# Patient Record
Sex: Female | Born: 1940
Health system: Southern US, Community
[De-identification: ages and names within clinical notes are randomized; demographics above are authoritative.]

## PROBLEM LIST (undated history)

## (undated) DIAGNOSIS — Z950 Presence of cardiac pacemaker: Secondary | ICD-10-CM

## (undated) DIAGNOSIS — I4891 Unspecified atrial fibrillation: Secondary | ICD-10-CM

## (undated) HISTORY — PX: APPENDECTOMY: SHX54

## (undated) HISTORY — PX: ABDOMINAL HYSTERECTOMY: SHX81

## (undated) HISTORY — PX: HERNIA REPAIR: SHX51

## (undated) HISTORY — PX: TOTAL KNEE ARTHROPLASTY: SHX125

## (undated) HISTORY — PX: CHOLECYSTECTOMY: SHX55

---

## 2004-10-13 ENCOUNTER — Encounter: Admission: RE | Admit: 2004-10-13 | Discharge: 2004-10-13 | Payer: Self-pay | Admitting: Rheumatology

## 2004-10-13 IMAGING — CR DG CHEST 2V
2 series · 2 of 2 positions shown · non-contrast
Comparison: None.
 Heart size is normal.  A small hiatal hernia is seen.  Both lungs are clear.

CLINICAL DATA: Rheumatoid arthritis.  Previous smoker.  Screening prior to methotrexate therapy.
 CHEST ? TWO VIEW:

[view not recorded (1 of 2)]
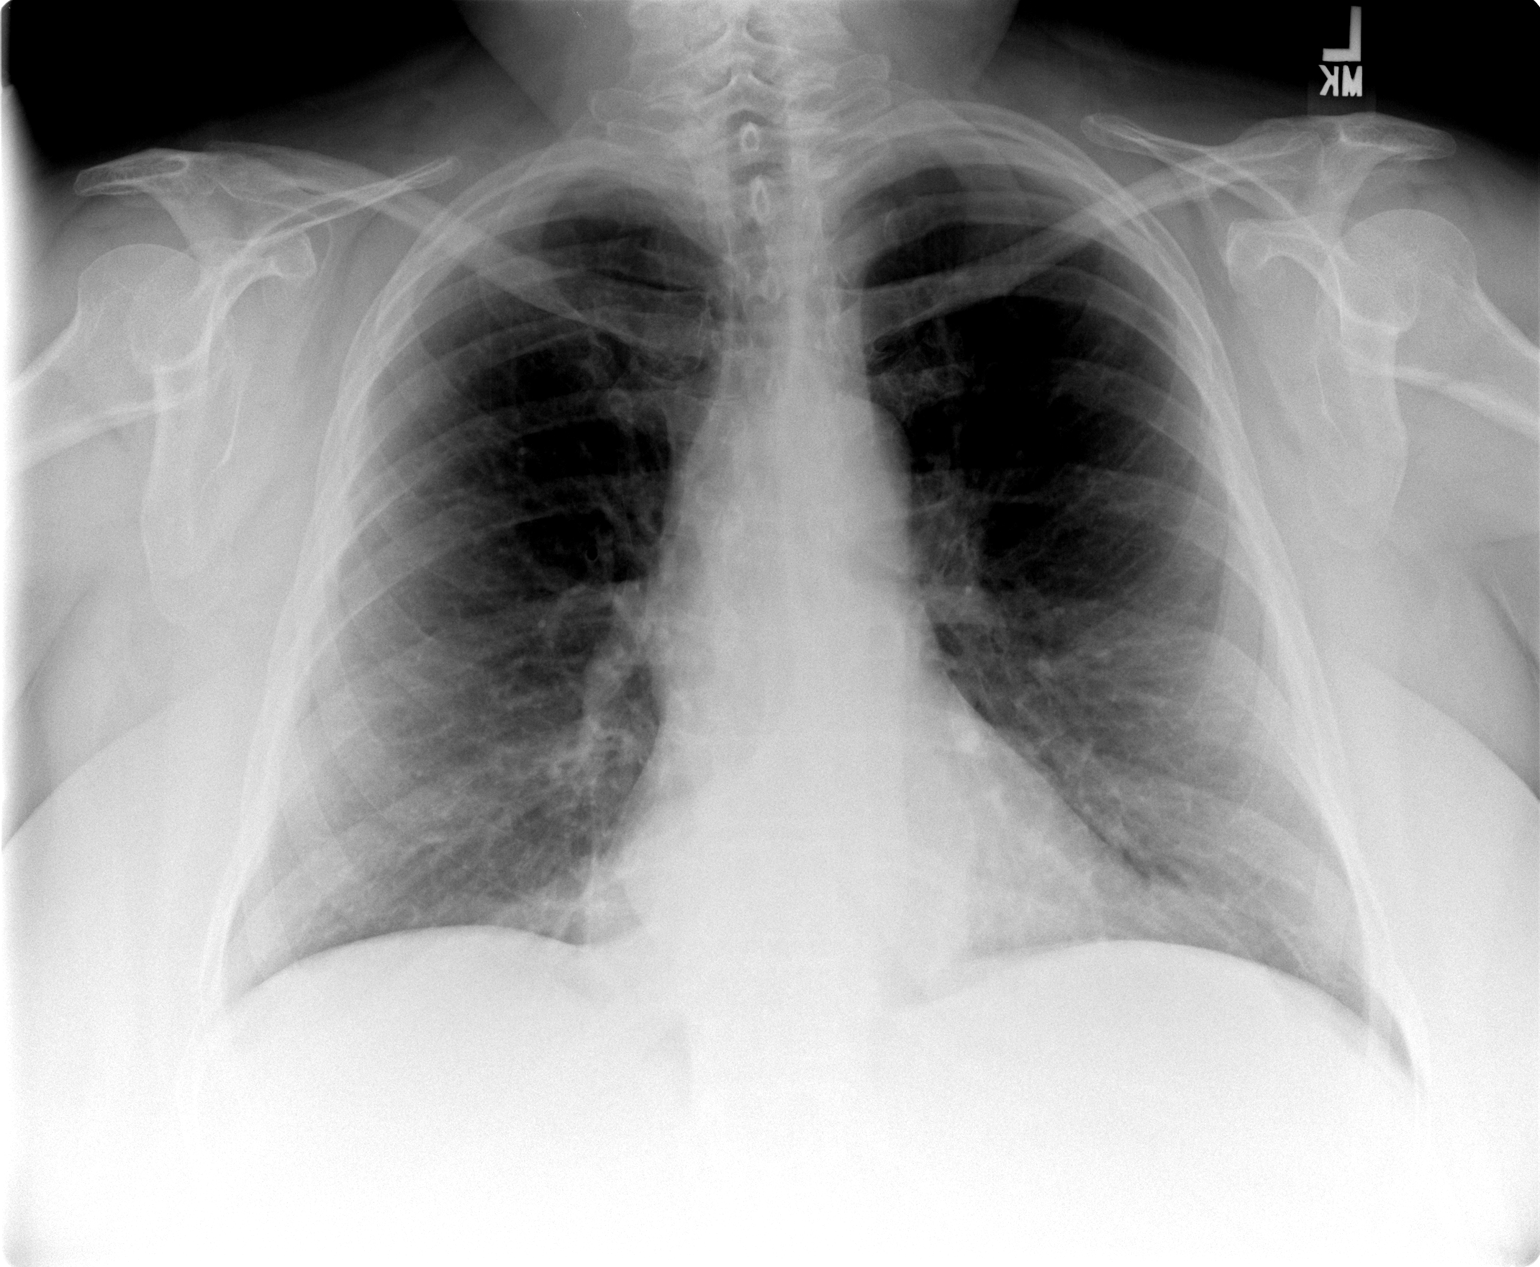

[view not recorded (2 of 2)]
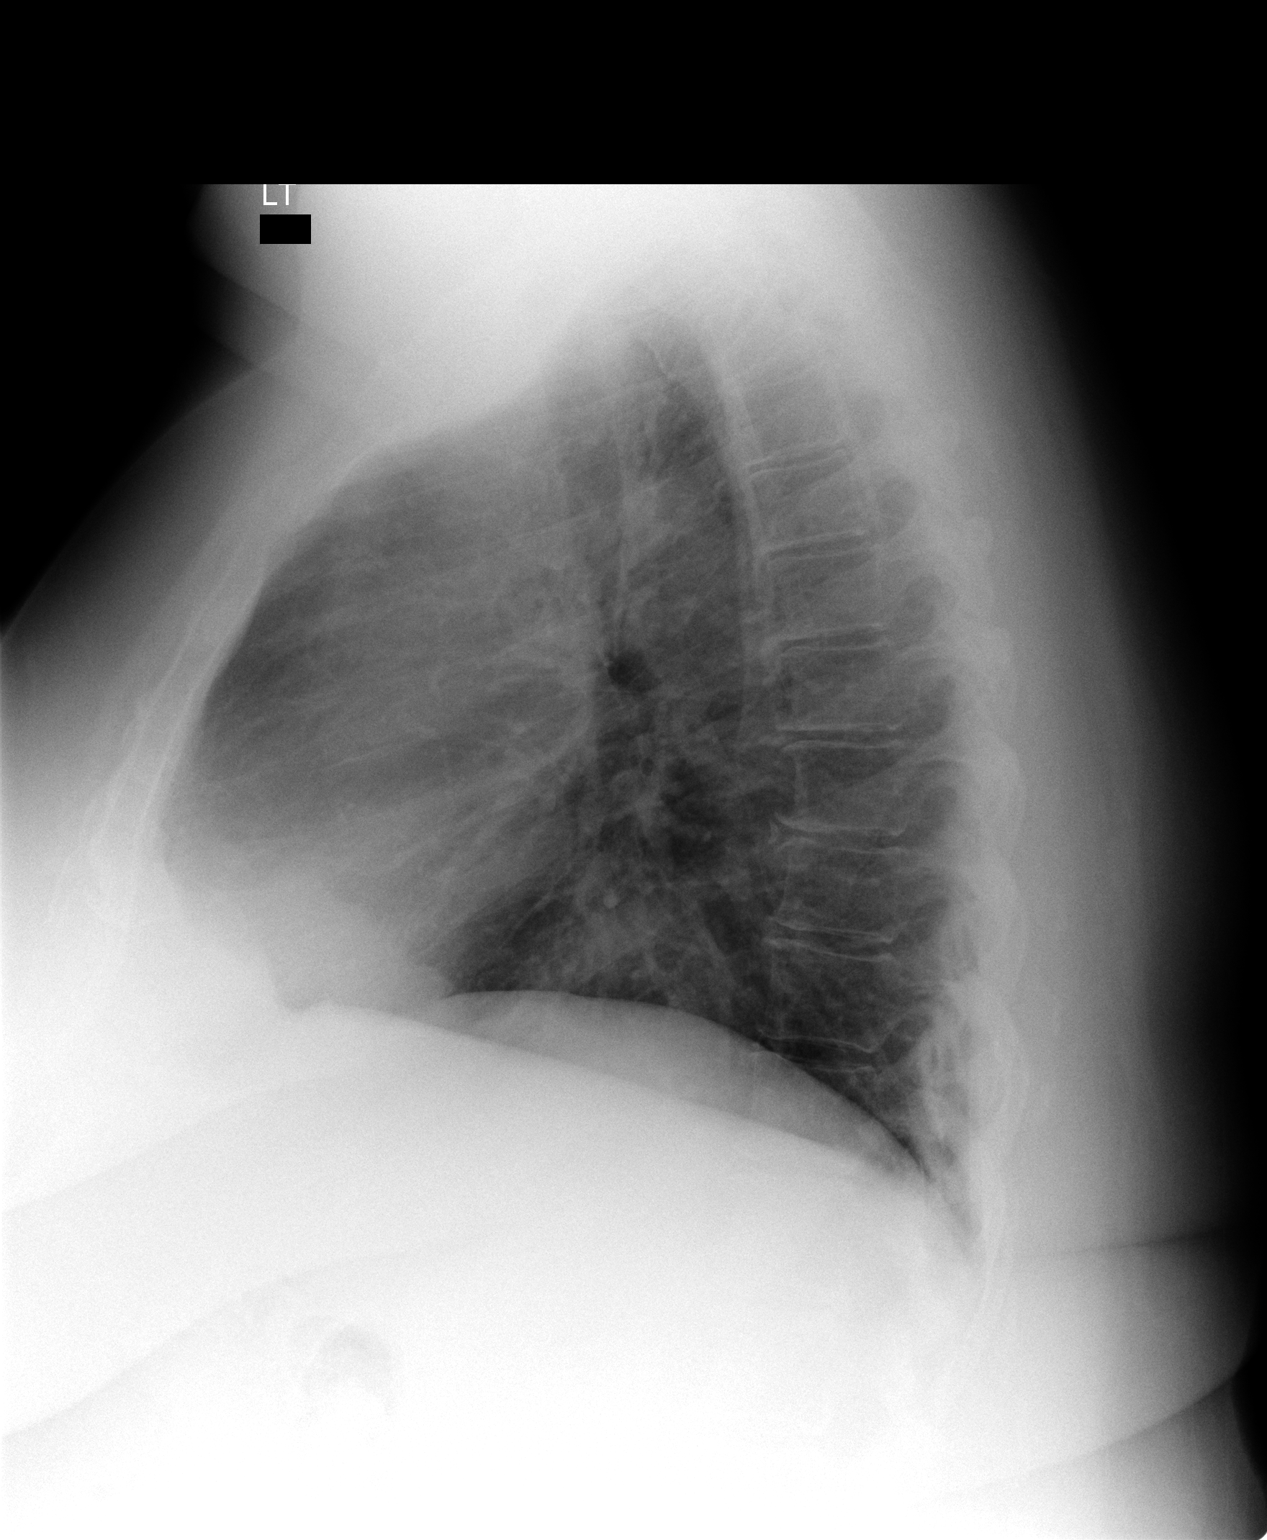

[2 of 2 positions shown; findings below may reference images not displayed]

IMPRESSION: 1.  No active cardiopulmonary disease.
 2.  Small hiatal hernia.

## 2006-01-22 ENCOUNTER — Encounter: Admission: RE | Admit: 2006-01-22 | Discharge: 2006-01-22 | Payer: Self-pay | Admitting: Neurosurgery

## 2010-08-28 ENCOUNTER — Encounter: Payer: Self-pay | Admitting: Neurosurgery

## 2013-08-13 ENCOUNTER — Other Ambulatory Visit: Payer: Self-pay

## 2013-08-13 DIAGNOSIS — Z1231 Encounter for screening mammogram for malignant neoplasm of breast: Secondary | ICD-10-CM

## 2013-09-03 ENCOUNTER — Ambulatory Visit
Admission: RE | Admit: 2013-09-03 | Discharge: 2013-09-03 | Disposition: A | Discharge status: Home / Self Care | Payer: Medicare HMO | Source: Ambulatory Visit

## 2013-09-03 DIAGNOSIS — Z1231 Encounter for screening mammogram for malignant neoplasm of breast: Secondary | ICD-10-CM

## 2015-05-04 ENCOUNTER — Other Ambulatory Visit: Payer: Self-pay

## 2015-05-05 ENCOUNTER — Other Ambulatory Visit: Payer: Self-pay

## 2015-05-05 ENCOUNTER — Other Ambulatory Visit: Payer: Self-pay | Admitting: Internal Medicine

## 2015-05-05 DIAGNOSIS — Z13820 Encounter for screening for osteoporosis: Secondary | ICD-10-CM

## 2015-05-05 DIAGNOSIS — Z1231 Encounter for screening mammogram for malignant neoplasm of breast: Secondary | ICD-10-CM

## 2018-01-08 ENCOUNTER — Encounter (HOSPITAL_BASED_OUTPATIENT_CLINIC_OR_DEPARTMENT_OTHER): Payer: Self-pay | Admitting: *Deleted

## 2018-01-08 ENCOUNTER — Other Ambulatory Visit: Payer: Self-pay

## 2018-01-08 ENCOUNTER — Emergency Department (HOSPITAL_BASED_OUTPATIENT_CLINIC_OR_DEPARTMENT_OTHER)
Admission: EM | Admit: 2018-01-08 | Discharge: 2018-01-08 | Disposition: A | Discharge status: Home / Self Care | Payer: Medicare HMO | Attending: Emergency Medicine | Admitting: Emergency Medicine

## 2018-01-08 DIAGNOSIS — Z950 Presence of cardiac pacemaker: Secondary | ICD-10-CM | POA: Diagnosis not present

## 2018-01-08 DIAGNOSIS — S90112A Contusion of left great toe without damage to nail, initial encounter: Secondary | ICD-10-CM | POA: Insufficient documentation

## 2018-01-08 DIAGNOSIS — L03116 Cellulitis of left lower limb: Secondary | ICD-10-CM | POA: Diagnosis not present

## 2018-01-08 DIAGNOSIS — Y939 Activity, unspecified: Secondary | ICD-10-CM | POA: Diagnosis not present

## 2018-01-08 DIAGNOSIS — T148XXA Other injury of unspecified body region, initial encounter: Secondary | ICD-10-CM

## 2018-01-08 DIAGNOSIS — Y929 Unspecified place or not applicable: Secondary | ICD-10-CM | POA: Insufficient documentation

## 2018-01-08 DIAGNOSIS — Y999 Unspecified external cause status: Secondary | ICD-10-CM | POA: Diagnosis not present

## 2018-01-08 DIAGNOSIS — X58XXXA Exposure to other specified factors, initial encounter: Secondary | ICD-10-CM | POA: Insufficient documentation

## 2018-01-08 DIAGNOSIS — L03032 Cellulitis of left toe: Secondary | ICD-10-CM

## 2018-01-08 HISTORY — DX: Unspecified atrial fibrillation: I48.91

## 2018-01-08 HISTORY — DX: Presence of cardiac pacemaker: Z95.0

## 2018-01-08 MED ORDER — SULFAMETHOXAZOLE-TRIMETHOPRIM 800-160 MG PO TABS
1.0000 | ORAL_TABLET | Freq: Once | ORAL | Status: AC
  Administered 2018-01-08: 1 via ORAL
  Filled 2018-01-08: qty 1

## 2018-01-08 MED ORDER — CEPHALEXIN 500 MG PO CAPS: 1000.0000 mg | ORAL_CAPSULE | Freq: Two times a day (BID) | ORAL | 0 refills | Status: DC

## 2018-01-08 MED ORDER — CEPHALEXIN 250 MG PO CAPS
1000.0000 mg | ORAL_CAPSULE | Freq: Once | ORAL | Status: AC
  Administered 2018-01-08: 1000 mg via ORAL
  Filled 2018-01-08: qty 4

## 2018-01-08 MED ORDER — SULFAMETHOXAZOLE-TRIMETHOPRIM 800-160 MG PO TABS: 1.0000 | ORAL_TABLET | Freq: Two times a day (BID) | ORAL | 0 refills | Status: AC

## 2018-01-08 NOTE — ED Provider Notes (Signed)
[facility] EMERGENCY DEPARTMENT Provider Note   CSN: [id number] Arrival date & time: 01/08/18  1955     History   Chief Complaint Chief Complaint  Patient presents with  . Toe Pain    HPI Marie Adkins is a 77 y.o. female.  HPI Patient reports she has very poor sensation in her feet.  She had not noticed it but she had developed a blood blister on the outside of her great toe by the nail.  Yesterday her sister poked it with a needle to release some blood.  Today, her daughter remarked that it looks swollen and red.  Patient reports she has very poor feeling in the feet and does not really perceive pain and does not know if she might of injured it.  She denies fever chills or malaise.  She is diabetic.  Patient reports she is chronically anticoagulated on Eliquis. Past Medical History:  Diagnosis Date  . Atrial fibrillation ([place])   . Pacemaker     There are no active problems to display for this patient.   Past Surgical History:  Procedure Laterality Date  . ABDOMINAL HYSTERECTOMY    . APPENDECTOMY    . CHOLECYSTECTOMY    . HERNIA REPAIR    . TOTAL KNEE ARTHROPLASTY       OB History   None      Home Medications    Prior to Admission medications   Medication Sig Start Date End Date Taking? Authorizing Provider  cephALEXin (KEFLEX) 500 MG capsule Take 2 capsules (1,000 mg total) by mouth 2 (two) times daily. 01/08/18   Charlesetta Shanks, MD  sulfamethoxazole-trimethoprim (BACTRIM DS,SEPTRA DS) 800-160 MG tablet Take 1 tablet by mouth 2 (two) times daily for 7 days. 01/08/18 01/15/18  Charlesetta Shanks, MD    Family History No family history on file.  Social History Social History   Tobacco Use  . Smoking status: Never Smoker  . Smokeless tobacco: Never Used  Substance Use Topics  . Alcohol use: Never    Frequency: Never  . Drug use: Never     Allergies   Patient has no known allergies.   Review of Systems Review of Systems Constitutional: No  fever no chills no malaise GI: No abdominal pain nausea or vomiting Respiratory: No cough or shortness of breath  Physical Exam Updated Vital Signs BP 110/60 (BP Location: Right Arm)   Pulse 89   Temp 98 F (36.7 C)   Resp 20   Ht 5\' 3"  (1.6 m)   Wt 108.9 kg (240 lb)   SpO2 100%   BMI 42.51 kg/m   Physical Exam  Constitutional: She is oriented to person, place, and time.  Patient is alert and nontoxic.  She is clinically well in appearance.  No respiratory distress.  HENT:  Head: Normocephalic and atraumatic.  Eyes: EOM are normal.  Pulmonary/Chest: Effort normal.  Musculoskeletal: Normal range of motion.  Patient has hematoma around the lateral aspect of the nailbed of the great toe.  Some erythema developing around the edge of the hematoma.  Toenail itself does not appear to be ingrown or embedded.  See attached images  Neurological: She is alert and oriented to person, place, and time. She exhibits normal muscle tone. Coordination normal.  Skin: Skin is warm and dry.  Psychiatric: She has a normal mood and affect.           ED Treatments / Results  Labs (all labs ordered are listed, but only abnormal  results are displayed) Labs Reviewed - No data to display  EKG None  Radiology No results found.  Procedures Procedures (including critical care time)  Medications Ordered in ED Medications  cephALEXin (KEFLEX) capsule 1,000 mg (1,000 mg Oral Given 01/08/18 2201)  sulfamethoxazole-trimethoprim (BACTRIM DS,SEPTRA DS) 800-160 MG per tablet 1 tablet (1 tablet Oral Given 01/08/18 2201)     Initial Impression / Assessment and Plan / ED Course  I have reviewed the triage vital signs and the nursing notes.  Pertinent labs & imaging results that were available during my care of the patient were reviewed by me and considered in my medical decision making (see chart for details).      Final Clinical Impressions(s) / ED Diagnoses   Final diagnoses:  Hematoma    Cellulitis of toe of left foot  Patient presents with a hematoma beside her toe that I suspect was initially traumatic.  The toenail does not appear to be ingrown and there is no erythema at the distal paronychial fold.  Her family member did try to trephinate this for her yesterday.  I suspect some secondary infection which at this time is limited to the surrounding area of the hematoma.  Will initiate patient on Keflex and Bactrim.  She is advised necessity for close follow-up and monitoring for any advancing infection.  Return precautions reviewed.    ED Discharge Orders        Ordered    cephALEXin (KEFLEX) 500 MG capsule  2 times daily     01/08/18 2235    sulfamethoxazole-trimethoprim (BACTRIM DS,SEPTRA DS) 800-160 MG tablet  2 times daily     01/08/18 2235       Charlesetta Shanks, MD 01/08/18 2247

## 2018-01-08 NOTE — ED Notes (Signed)
D/C instructions and prescription given to secretary to mail

## 2018-01-08 NOTE — ED Triage Notes (Signed)
Blood blister on her left great toe. Her sister opened it with a needle. Site is red, swollen and painful.

## 2018-01-08 NOTE — Discharge Instructions (Addendum)
Leave your blood blister intact.  Irrigate the toe well with running water twice daily.  That dry and then apply antibiotic ointment.  Take Bactrim and Keflex twice daily as prescribed. Follow-up with podiatry within the next 1 to 2 days. Return to the emergency department if you have increasing redness, pain, fever or signs of advancing infection.

## 2018-01-08 NOTE — ED Notes (Signed)
EDP at the bedside.  ?

## 2018-01-08 NOTE — ED Notes (Signed)
States," I have to go now, my daughter is in the waiting room and she has a special needs child" Requested to have d/c paperwork mailed to her. Informed ED MD

## 2018-06-24 ENCOUNTER — Encounter (HOSPITAL_COMMUNITY): Payer: Self-pay

## 2018-06-24 ENCOUNTER — Emergency Department (HOSPITAL_COMMUNITY)
Admission: EM | Admit: 2018-06-24 | Discharge: 2018-06-24 | Disposition: A | Discharge status: Home / Self Care | Payer: Medicare HMO | Attending: Emergency Medicine | Admitting: Emergency Medicine

## 2018-06-24 DIAGNOSIS — M79604 Pain in right leg: Secondary | ICD-10-CM | POA: Diagnosis present

## 2018-06-24 DIAGNOSIS — R1084 Generalized abdominal pain: Secondary | ICD-10-CM | POA: Insufficient documentation

## 2018-06-24 DIAGNOSIS — Z5321 Procedure and treatment not carried out due to patient leaving prior to being seen by health care provider: Secondary | ICD-10-CM | POA: Diagnosis not present

## 2018-06-24 NOTE — ED Triage Notes (Signed)
Pt states that today she noticed bruising redness and swelling to her  R shin area and R flank area. Painful to touch, denies fevers.

## 2018-06-24 NOTE — ED Notes (Signed)
Pt did not want to wait and left Stated she waited too long and wanted to go home.

## 2020-02-04 ENCOUNTER — Telehealth: Payer: Self-pay | Admitting: General Practice

## 2020-02-04 NOTE — Telephone Encounter (Signed)
Yes. As long as it is on a Wed or Thurs when I am here that is totally fine.  Thanks. MS LPN

## 2020-02-04 NOTE — Telephone Encounter (Signed)
Patient insurance called and wanted to see if patient can be scheduled for an Annual Wellness Visit the same day as her physical with Dr. Bryan Lemma on 8/4, please advise. CB is [phone]

## 2020-02-05 DIAGNOSIS — M1712 Unilateral primary osteoarthritis, left knee: Secondary | ICD-10-CM | POA: Diagnosis not present

## 2020-02-06 NOTE — Telephone Encounter (Signed)
LVM for patient to call back to schedule Medicare Annual Well Visit.

## 2020-02-13 DIAGNOSIS — I509 Heart failure, unspecified: Secondary | ICD-10-CM | POA: Diagnosis not present

## 2020-03-08 NOTE — Patient Instructions (Addendum)
Health Maintenance Due  Topic Date Due  . Hepatitis C Screening  Never done  . URINE MICROALBUMIN  Never done  . COVID-19 Vaccine (1) Never done  . DEXA SCAN Discussed on visit Never done  . PNA vac Low Risk Adult (2 of 2 - PPSV23) Discussed on visit 08/26/2006  . TETANUS/TDAP Discussed on visit 12/31/2012  . INFLUENZA VACCINE  03/07/2020    No flowsheet data found.

## 2020-03-09 ENCOUNTER — Other Ambulatory Visit: Payer: Self-pay

## 2020-03-10 ENCOUNTER — Ambulatory Visit (INDEPENDENT_AMBULATORY_CARE_PROVIDER_SITE_OTHER): Payer: Medicare Other | Admitting: Family Medicine

## 2020-03-10 ENCOUNTER — Ambulatory Visit: Payer: Medicare HMO | Admitting: Family Medicine

## 2020-03-10 ENCOUNTER — Encounter: Payer: Self-pay | Admitting: Family Medicine

## 2020-03-10 VITALS — BP 128/76 | HR 86 | Temp 96.8°F | Ht 63.5 in | Wt 244.6 lb

## 2020-03-10 DIAGNOSIS — Z0000 Encounter for general adult medical examination without abnormal findings: Secondary | ICD-10-CM

## 2020-03-10 DIAGNOSIS — I5042 Chronic combined systolic (congestive) and diastolic (congestive) heart failure: Secondary | ICD-10-CM

## 2020-03-10 DIAGNOSIS — E782 Mixed hyperlipidemia: Secondary | ICD-10-CM | POA: Diagnosis not present

## 2020-03-10 DIAGNOSIS — Z1211 Encounter for screening for malignant neoplasm of colon: Secondary | ICD-10-CM

## 2020-03-10 DIAGNOSIS — Z2821 Immunization not carried out because of patient refusal: Secondary | ICD-10-CM

## 2020-03-10 DIAGNOSIS — I4719 Other supraventricular tachycardia: Secondary | ICD-10-CM

## 2020-03-10 DIAGNOSIS — I471 Supraventricular tachycardia: Secondary | ICD-10-CM

## 2020-03-10 DIAGNOSIS — R7301 Impaired fasting glucose: Secondary | ICD-10-CM | POA: Diagnosis not present

## 2020-03-10 DIAGNOSIS — Z13820 Encounter for screening for osteoporosis: Secondary | ICD-10-CM

## 2020-03-10 DIAGNOSIS — Z1321 Encounter for screening for nutritional disorder: Secondary | ICD-10-CM

## 2020-03-10 DIAGNOSIS — E119 Type 2 diabetes mellitus without complications: Secondary | ICD-10-CM | POA: Insufficient documentation

## 2020-03-10 DIAGNOSIS — Z1231 Encounter for screening mammogram for malignant neoplasm of breast: Secondary | ICD-10-CM

## 2020-03-10 NOTE — Progress Notes (Signed)
Marie Adkins is a 79 y.o. female  Chief Complaint  Patient presents with  . Establish Care    Pt here for physical today.  pt is not fasting for lab work..  Pt would like to discuss diarreha worsen over the past few months.      HPI: *Marie Adkins is a 79 y.o. female here as a new patient to establish care with our office. She is not fasting for labs. Last labs: CMP in 10/2019; CBC, lipid panel, TSH in 03/2019. She is accompanied by her daughter.   She has been having episodes of "explosive diarrhea" at times where she cannot make it to the bathroom in the past 6-34mo. These occur 1x/every 1-2 wks and otherwise in between she has normal BMs.  No blood in stool. Not associated with type of food or when she's eaten. This has been an issue on/off x years since GB removed.   She follows with ortho ([facility]), cardio (Dr. Elonda Husky - [facility] but pt is switching to new cardio and has appt scheduled), ophthalmology [facility] - Dr. Babette Relic), pulm/sleep   Last mammo: 04/2019 - [facility] Last Dexa: overdue Last colonoscopy: ?5 years ago ([facility]) - mother with CRC   Past Medical History:  Diagnosis Date  . Atrial fibrillation ([place])   . Pacemaker     Past Surgical History:  Procedure Laterality Date  . ABDOMINAL HYSTERECTOMY    . APPENDECTOMY    . CHOLECYSTECTOMY    . HERNIA REPAIR    . TOTAL KNEE ARTHROPLASTY      Social History   Socioeconomic History  . Marital status: Married    Spouse name: Not on file  . Number of children: Not on file  . Years of education: Not on file  . Highest education level: Not on file  Occupational History  . Not on file  Tobacco Use  . Smoking status: Never Smoker  . Smokeless tobacco: Never Used  Substance and Sexual Activity  . Alcohol use: Never  . Drug use: Never  . Sexual activity: Not on file  Other Topics Concern  . Not on file  Social History Narrative  . Not on file   Social Determinants of Health   Financial  Resource Strain:   . Difficulty of Paying Living Expenses:   Food Insecurity:   . Worried About Charity fundraiser in the Last Year:   . Arboriculturist in the Last Year:   Transportation Needs:   . Film/video editor (Medical):   Marland Kitchen Lack of Transportation (Non-Medical):   Physical Activity:   . Days of Exercise per Week:   . Minutes of Exercise per Session:   Stress:   . Feeling of Stress :   Social Connections:   . Frequency of Communication with Friends and Family:   . Frequency of Social Gatherings with Friends and Family:   . Attends Religious Services:   . Active Member of Clubs or Organizations:   . Attends Archivist Meetings:   Marland Kitchen Marital Status:   Intimate Partner Violence:   . Fear of Current or Ex-Partner:   . Emotionally Abused:   Marland Kitchen Physically Abused:   . Sexually Abused:     History reviewed. No pertinent family history.    There is no immunization history on file for this patient.  Outpatient Encounter Medications as of 03/10/2020  Medication Sig Note  . acetaminophen (TYLENOL) 500 MG tablet Take  by mouth.   Marland Kitchen apixaban (ELIQUIS) 5 MG TABS tablet Take 1 tablet by mouth 2 (two) times daily.   Marland Kitchen ascorbic Acid (VITAMIN C) 500 MG CPCR Take by mouth.   Marland Kitchen buPROPion (WELLBUTRIN XL) 150 MG 24 hr tablet Take 1 tablet by mouth daily.   . carvedilol (COREG) 6.25 MG tablet Take by mouth.   . colestipol (COLESTID) 1 g tablet Take by mouth.   . gabapentin (NEURONTIN) 600 MG tablet TAKE 3 TABLETS BY MOUTH EVERY MORNING AND 3 EACH EVENING   . melatonin 5 MG TABS Take 5 mg by mouth.   . MULTIPLE VITAMINS PO Take by mouth. 03/10/2020: -Minerals (PRESERVISION AREDS 2 PO)  . pantoprazole (PROTONIX) 40 MG tablet Take 40 mg by mouth 2 (two) times daily.   Marland Kitchen QUERCETIN PO Take 500 mg by mouth.   Marland Kitchen rOPINIRole (REQUIP) 3 MG tablet Take by mouth.   . sertraline (ZOLOFT) 100 MG tablet Take 1 tablet by mouth daily.   . Simethicone 125 MG CAPS Take by mouth.   .  spironolactone (ALDACTONE) 25 MG tablet Take 1 tablet by mouth daily.   Marland Kitchen torsemide (DEMADEX) 20 MG tablet Take 1 tablet by mouth 2 (two) times daily.   Marland Kitchen zinc gluconate 50 MG tablet Take by mouth.   . [DISCONTINUED] cephALEXin (KEFLEX) 500 MG capsule Take 2 capsules (1,000 mg total) by mouth 2 (two) times daily. (Patient not taking: Reported on 03/10/2020)    No facility-administered encounter medications on file as of 03/10/2020.     ROS: Pertinent positives and negatives noted in HPI. Remainder of ROS non-contributory   Allergies  Allergen Reactions  . Methotrexate Derivatives Itching  . Codeine Other (See Comments)    Hallucinations   . Atorvastatin Other (See Comments)    Muscle pain, weakness, and malaise.   . Flecainide Other (See Comments)  . Oxycodone Itching    BP 128/76 (BP Location: Left Arm, Patient Position: Sitting, Cuff Size: Normal)   Pulse 86   Temp (!) 96.8 F (36 C) (Temporal)   Ht 5' 3.5" (1.613 m)   Wt 244 lb 9.6 oz (110.9 kg)   SpO2 95%   BMI 42.65 kg/m   Physical Exam Constitutional:      General: She is not in acute distress.    Appearance: She is obese. She is not ill-appearing.  Cardiovascular:     Rate and Rhythm: Normal rate and regular rhythm.  Pulmonary:     Effort: No respiratory distress.     Breath sounds: Normal breath sounds.  Abdominal:     General: Bowel sounds are normal.  Musculoskeletal:     Right lower leg: Edema present.     Left lower leg: Edema present.  Skin:    General: Skin is warm and dry.  Neurological:     General: No focal deficit present.     Mental Status: She is alert and oriented to person, place, and time.  Psychiatric:        Mood and Affect: Mood normal.        Behavior: Behavior normal.      A/P:    1. Mixed hyperlipidemia - Lipid panel; Future - intolerance to statin  2. IFG (impaired fasting glucose) - Comprehensive metabolic panel; Future - Hemoglobin A1c; Future - not on any meds - no  regular exercise  3. Encounter for screening mammogram for malignant neoplasm of breast - MM DIGITAL SCREENING BILATERAL; Future  4. Screening for osteoporosis - DG  Bone Density; Future  5. Encounter for vitamin deficiency screening - VITAMIN D 25 Hydroxy (Vit-D Deficiency, Fractures); Future  6. Screening for colon cancer - Ambulatory referral to Gastroenterology  7. Pneumococcal vaccination declined  8. PAT (paroxysmal atrial tachycardia) (HCC) 9. Chronic combined systolic and diastolic heart failure ([place]) - pt has pacemaker - has cardio appt next week - cont current meds  This visit occurred during the SARS-CoV-2 public health emergency.  Safety protocols were in place, including screening questions prior to the visit, additional usage of staff PPE, and extensive cleaning of exam room while observing appropriate contact time as indicated for disinfecting solutions.

## 2020-03-15 DIAGNOSIS — I509 Heart failure, unspecified: Secondary | ICD-10-CM | POA: Diagnosis not present

## 2020-03-16 DIAGNOSIS — I429 Cardiomyopathy, unspecified: Secondary | ICD-10-CM | POA: Diagnosis not present

## 2020-03-24 DIAGNOSIS — Z86010 Personal history of colonic polyps: Secondary | ICD-10-CM | POA: Diagnosis not present

## 2020-03-24 DIAGNOSIS — Z7901 Long term (current) use of anticoagulants: Secondary | ICD-10-CM | POA: Diagnosis not present

## 2020-03-24 DIAGNOSIS — R197 Diarrhea, unspecified: Secondary | ICD-10-CM | POA: Diagnosis not present

## 2020-03-24 DIAGNOSIS — R1032 Left lower quadrant pain: Secondary | ICD-10-CM | POA: Diagnosis not present

## 2020-03-24 DIAGNOSIS — Z1211 Encounter for screening for malignant neoplasm of colon: Secondary | ICD-10-CM | POA: Diagnosis not present

## 2020-03-25 ENCOUNTER — Other Ambulatory Visit: Payer: Self-pay | Admitting: Family Medicine

## 2020-03-25 ENCOUNTER — Other Ambulatory Visit (INDEPENDENT_AMBULATORY_CARE_PROVIDER_SITE_OTHER): Payer: Medicare Other

## 2020-03-25 DIAGNOSIS — R399 Unspecified symptoms and signs involving the genitourinary system: Secondary | ICD-10-CM

## 2020-03-25 DIAGNOSIS — R7301 Impaired fasting glucose: Secondary | ICD-10-CM | POA: Diagnosis not present

## 2020-03-25 DIAGNOSIS — E782 Mixed hyperlipidemia: Secondary | ICD-10-CM

## 2020-03-25 DIAGNOSIS — Z1321 Encounter for screening for nutritional disorder: Secondary | ICD-10-CM

## 2020-03-25 LAB — COMPREHENSIVE METABOLIC PANEL
ALT: 9 U/L (ref 0–35)
AST: 12 U/L (ref 0–37)
Albumin: 3.8 g/dL (ref 3.5–5.2)
Alkaline Phosphatase: 82 U/L (ref 39–117)
BUN: 24 mg/dL — ABNORMAL HIGH (ref 6–23)
CO2: 28 mEq/L (ref 19–32)
Calcium: 9.6 mg/dL (ref 8.4–10.5)
Chloride: 101 mEq/L (ref 96–112)
Creatinine, Ser: 1.12 mg/dL (ref 0.40–1.20)
GFR: 46.98 mL/min — ABNORMAL LOW (ref >60.00)
Glucose, Bld: 119 mg/dL — ABNORMAL HIGH (ref 70–99)
Potassium: 4 mEq/L (ref 3.5–5.1)
Sodium: 139 mEq/L (ref 135–145)
Total Bilirubin: 0.4 mg/dL (ref 0.2–1.2)
Total Protein: 6.7 g/dL (ref 6.0–8.3)

## 2020-03-25 LAB — LIPID PANEL
Cholesterol: 209 mg/dL — ABNORMAL HIGH (ref 0–200)
HDL: 27 mg/dL — ABNORMAL LOW (ref >39.00)
NonHDL: 181.57
Total CHOL/HDL Ratio: 8
Triglycerides: 328 mg/dL — ABNORMAL HIGH (ref 0.0–149.0)
VLDL: 65.6 mg/dL — ABNORMAL HIGH (ref 0.0–40.0)

## 2020-03-25 LAB — HEMOGLOBIN A1C: Hgb A1c MFr Bld: 6.8 % — ABNORMAL HIGH (ref 4.6–6.5)

## 2020-03-25 LAB — LDL CHOLESTEROL, DIRECT: Direct LDL: 139 mg/dL

## 2020-03-25 LAB — VITAMIN D 25 HYDROXY (VIT D DEFICIENCY, FRACTURES): VITD: 22.45 ng/mL — ABNORMAL LOW (ref 30.00–100.00)

## 2020-03-31 ENCOUNTER — Telehealth: Payer: Self-pay | Admitting: Family Medicine

## 2020-03-31 DIAGNOSIS — E559 Vitamin D deficiency, unspecified: Secondary | ICD-10-CM | POA: Insufficient documentation

## 2020-03-31 MED ORDER — VITAMIN D (ERGOCALCIFEROL) 1.25 MG (50000 UNIT) PO CAPS: 50000.0000 [IU] | ORAL_CAPSULE | ORAL | 2 refills | Status: AC

## 2020-03-31 NOTE — Telephone Encounter (Signed)
A1C = 6.8 which indicated she not longer has prediabetes but is in fact a diabetic.  Her triglycerides are significantly elevated at 328 (normal is under 150) and total and LDL cholesterol are elevated and HDL (good chol) is low. I know she was not able to tolerate statin in the past but tricor or zetia may be good alternative options.  Vit D is low at 22 with goal being above 30. I'm going to send an Rx for Vit D 50,000IU 1 cap per week x 12 weeks. After that time she can switch to and OTC Vit D supplement 2000IU daily. It may be best for pt to schedule VV to discuss labs and med options.

## 2020-03-31 NOTE — Telephone Encounter (Signed)
Patient is calling and wanted to see if her lab results were back, please advise. CB is [phone]

## 2020-03-31 NOTE — Telephone Encounter (Signed)
Dr. Loletha Grayer please advise.  Pt asking about her lab results. I told her that once they were resulted I would give her a call back.

## 2020-04-01 ENCOUNTER — Telehealth: Payer: Self-pay | Admitting: Family Medicine

## 2020-04-01 NOTE — Telephone Encounter (Signed)
Patient is calling and requested a call back regarding labs, please advise. CB is [phone]

## 2020-04-02 DIAGNOSIS — R1032 Left lower quadrant pain: Secondary | ICD-10-CM | POA: Diagnosis not present

## 2020-04-02 DIAGNOSIS — M160 Bilateral primary osteoarthritis of hip: Secondary | ICD-10-CM | POA: Diagnosis not present

## 2020-04-02 DIAGNOSIS — R1030 Lower abdominal pain, unspecified: Secondary | ICD-10-CM | POA: Diagnosis not present

## 2020-04-02 DIAGNOSIS — K7689 Other specified diseases of liver: Secondary | ICD-10-CM | POA: Diagnosis not present

## 2020-04-02 DIAGNOSIS — R1010 Upper abdominal pain, unspecified: Secondary | ICD-10-CM | POA: Diagnosis not present

## 2020-04-02 DIAGNOSIS — K5790 Diverticulosis of intestine, part unspecified, without perforation or abscess without bleeding: Secondary | ICD-10-CM | POA: Diagnosis not present

## 2020-04-02 DIAGNOSIS — K760 Fatty (change of) liver, not elsewhere classified: Secondary | ICD-10-CM | POA: Diagnosis not present

## 2020-04-02 NOTE — Telephone Encounter (Signed)
Pt was notified and she made an appointment with Dr. Loletha Grayer virtually on 04/07/2020 at 1:30pm.

## 2020-04-02 NOTE — Telephone Encounter (Signed)
Pt was notified and made an appointment to discuss labs and go over medication suggestions with Dr. Loletha Grayer on 04/07/2020 at 1:30pm.

## 2020-04-07 ENCOUNTER — Other Ambulatory Visit: Payer: Self-pay

## 2020-04-07 ENCOUNTER — Encounter: Payer: Self-pay | Admitting: Family Medicine

## 2020-04-07 ENCOUNTER — Telehealth (INDEPENDENT_AMBULATORY_CARE_PROVIDER_SITE_OTHER): Payer: Medicare Other | Admitting: Family Medicine

## 2020-04-07 VITALS — Ht 63.5 in | Wt 240.0 lb

## 2020-04-07 DIAGNOSIS — E782 Mixed hyperlipidemia: Secondary | ICD-10-CM

## 2020-04-07 DIAGNOSIS — E559 Vitamin D deficiency, unspecified: Secondary | ICD-10-CM

## 2020-04-07 DIAGNOSIS — E119 Type 2 diabetes mellitus without complications: Secondary | ICD-10-CM | POA: Diagnosis not present

## 2020-04-07 NOTE — Progress Notes (Signed)
Virtual Visit via Video Note  I connected with Marie Adkins on 04/07/20 at  1:30 PM EDT by a video enabled telemedicine application and verified that I am speaking with the correct person using two identifiers. Location patient: home Location provider: work  Persons participating in the virtual visit: patient, provider  I discussed the limitations of evaluation and management by telemedicine and the availability of in person appointments. The patient expressed understanding and agreed to proceed.  Chief Complaint  Patient presents with  . Follow-up    to discuss lab results and medications//no recent eye//no covid vaccine     HPI: TALITHA DICARLO is a 79 y.o. female seen today to discuss recent lab results. 1. A1C = 6.8 (6.4 in 12/2018) 2. Vit D = 22. Rx sent for 50,000IU x 12 wks then go back to 5000IU. 3. Total and LDL cholesterol elevated, HDL low, and TG significant elevated. Pt unable to tolerate statin in the past and has muscle pain, weakness, etc.    Lab Results  Component Value Date   CHOL 209 (H) 03/25/2020  (202 in 03/2019) Lab Results  Component Value Date   HDL 27.00 (L) 03/25/2020  (30 in 03/2019) No results found for: [facility] Lab Results  Component Value Date   TRIG 328.0 (H) 03/25/2020  (325 in 03/2019) Lab Results  Component Value Date   CHOLHDL 8 03/25/2020   Lab Results  Component Value Date   LDLDIRECT 139.0 03/25/2020  (107in 03/2019)  Lab Results  Component Value Date   HGBA1C 6.8 (H) 03/25/2020   Last vitamin D Lab Results  Component Value Date   VD25OH 22.45 (L) 03/25/2020     Past Medical History:  Diagnosis Date  . Atrial fibrillation ([place])   . Pacemaker     Past Surgical History:  Procedure Laterality Date  . ABDOMINAL HYSTERECTOMY    . APPENDECTOMY    . CHOLECYSTECTOMY    . HERNIA REPAIR    . TOTAL KNEE ARTHROPLASTY      No family history on file.  Social History   Tobacco Use  . Smoking status: Never Smoker  .  Smokeless tobacco: Never Used  Substance Use Topics  . Alcohol use: Never  . Drug use: Never     Current Outpatient Medications:  .  acetaminophen (TYLENOL) 500 MG tablet, Take by mouth., Disp: , Rfl:  .  apixaban (ELIQUIS) 5 MG TABS tablet, Take 1 tablet by mouth 2 (two) times daily., Disp: , Rfl:  .  ascorbic Acid (VITAMIN C) 500 MG CPCR, Take by mouth., Disp: , Rfl:  .  buPROPion (WELLBUTRIN XL) 150 MG 24 hr tablet, Take 1 tablet by mouth daily., Disp: , Rfl:  .  carvedilol (COREG) 6.25 MG tablet, Take by mouth., Disp: , Rfl:  .  colestipol (COLESTID) 1 g tablet, Take by mouth., Disp: , Rfl:  .  gabapentin (NEURONTIN) 600 MG tablet, TAKE 3 TABLETS BY MOUTH EVERY MORNING AND 3 EACH EVENING, Disp: , Rfl:  .  melatonin 5 MG TABS, Take 5 mg by mouth., Disp: , Rfl:  .  MULTIPLE VITAMINS PO, Take by mouth., Disp: , Rfl:  .  pantoprazole (PROTONIX) 40 MG tablet, Take 40 mg by mouth 2 (two) times daily., Disp: , Rfl:  .  QUERCETIN PO, Take 500 mg by mouth., Disp: , Rfl:  .  rOPINIRole (REQUIP) 3 MG tablet, Take by mouth., Disp: , Rfl:  .  sertraline (ZOLOFT) 100 MG tablet, Take 1 tablet by  mouth daily., Disp: , Rfl:  .  Simethicone 125 MG CAPS, Take by mouth., Disp: , Rfl:  .  spironolactone (ALDACTONE) 25 MG tablet, Take 1 tablet by mouth daily., Disp: , Rfl:  .  torsemide (DEMADEX) 20 MG tablet, Take 1 tablet by mouth 2 (two) times daily., Disp: , Rfl:  .  Vitamin D, Ergocalciferol, (DRISDOL) 1.25 MG (50000 UNIT) CAPS capsule, Take 1 capsule (50,000 Units total) by mouth every 7 (seven) days., Disp: 5 capsule, Rfl: 2 .  zinc gluconate 50 MG tablet, Take by mouth., Disp: , Rfl:   Allergies  Allergen Reactions  . Methotrexate Derivatives Itching  . Codeine Other (See Comments)    Hallucinations   . Atorvastatin Other (See Comments)    Muscle pain, weakness, and malaise.   . Flecainide Other (See Comments)  . Oxycodone Itching      ROS: See pertinent positives and negatives per  HPI.   EXAM:  VITALS per patient if applicable: Ht 5' 3.5" (1.613 m)   Wt 240 lb (108.9 kg) Comment: pt reported  BMI 41.85 kg/m    GENERAL: alert, oriented, appears well and in no acute distress  HEENT: atraumatic, conjunctiva clear, no obvious abnormalities on inspection of external nose and ears  NECK: normal movements of the head and neck  LUNGS: on inspection no signs of respiratory distress, breathing rate appears normal, no obvious gross SOB, gasping or wheezing, no conversational dyspnea  CV: no obvious cyanosis  PSYCH/NEURO: pleasant and cooperative, no obvious depression or anxiety, speech and thought processing grossly intact   ASSESSMENT AND PLAN:  1. Vitamin D deficiency - pt was previously on 5000IU daily  - she will take once weekly Vit D 50,000IU x 12 wks then resume 5000IU daily  2. Type 2 diabetes mellitus without complication, without long-term current use of insulin (HCC) - A1C increased from 6.4 to 6.8 in the past year - pt is not on medication, no regular exercise - stressed importance of regular walking as well as low sugar and low carb diet  3. Mixed hyperlipidemia - likely some genetic component to elevated TG - LDL not at goal - pt is unwilling to try another statin or another class of med like fenofibrate or zetia - pt is agreeable to omega 3 fish oil 1000mg  BID  Repeat labs in 4-42mo   I discussed the assessment and treatment plan with the patient. The patient was provided an opportunity to ask questions and all were answered. The patient agreed with the plan and demonstrated an understanding of the instructions.   The patient was advised to call back or seek an in-person evaluation if the symptoms worsen or if the condition fails to improve as anticipated.   Letta Median, DO

## 2020-04-09 ENCOUNTER — Telehealth: Payer: Self-pay | Admitting: Family Medicine

## 2020-04-09 NOTE — Telephone Encounter (Signed)
Dr. Loletha Grayer please advise or FYI.  Pt called in an was transferred to triage, pt took all her Vitamin D pills in one week instead of 1 a day. We are waiting to hear from triage or pt.

## 2020-04-09 NOTE — Telephone Encounter (Signed)
Marie Adkins called in stating she took all Vit D 50,000 units in 1 week (prescribed 8/25). I transferred pt to [facility] Nurse triage team. Waiting for their report.

## 2020-04-14 DIAGNOSIS — Z01812 Encounter for preprocedural laboratory examination: Secondary | ICD-10-CM | POA: Diagnosis not present

## 2020-04-15 DIAGNOSIS — I509 Heart failure, unspecified: Secondary | ICD-10-CM | POA: Diagnosis not present

## 2020-04-16 ENCOUNTER — Telehealth: Payer: Self-pay | Admitting: Family Medicine

## 2020-04-16 DIAGNOSIS — I429 Cardiomyopathy, unspecified: Secondary | ICD-10-CM | POA: Diagnosis not present

## 2020-04-16 DIAGNOSIS — D123 Benign neoplasm of transverse colon: Secondary | ICD-10-CM | POA: Diagnosis not present

## 2020-04-16 DIAGNOSIS — R109 Unspecified abdominal pain: Secondary | ICD-10-CM | POA: Diagnosis not present

## 2020-04-16 DIAGNOSIS — K219 Gastro-esophageal reflux disease without esophagitis: Secondary | ICD-10-CM | POA: Diagnosis not present

## 2020-04-16 DIAGNOSIS — G4730 Sleep apnea, unspecified: Secondary | ICD-10-CM | POA: Diagnosis not present

## 2020-04-16 DIAGNOSIS — I509 Heart failure, unspecified: Secondary | ICD-10-CM | POA: Diagnosis not present

## 2020-04-16 DIAGNOSIS — H353212 Exudative age-related macular degeneration, right eye, with inactive choroidal neovascularization: Secondary | ICD-10-CM | POA: Diagnosis not present

## 2020-04-16 DIAGNOSIS — D124 Benign neoplasm of descending colon: Secondary | ICD-10-CM | POA: Diagnosis not present

## 2020-04-16 DIAGNOSIS — R1032 Left lower quadrant pain: Secondary | ICD-10-CM | POA: Diagnosis not present

## 2020-04-16 DIAGNOSIS — K317 Polyp of stomach and duodenum: Secondary | ICD-10-CM | POA: Diagnosis not present

## 2020-04-16 DIAGNOSIS — D120 Benign neoplasm of cecum: Secondary | ICD-10-CM | POA: Diagnosis not present

## 2020-04-16 DIAGNOSIS — M1990 Unspecified osteoarthritis, unspecified site: Secondary | ICD-10-CM | POA: Diagnosis not present

## 2020-04-16 DIAGNOSIS — E785 Hyperlipidemia, unspecified: Secondary | ICD-10-CM | POA: Diagnosis not present

## 2020-04-16 DIAGNOSIS — I10 Essential (primary) hypertension: Secondary | ICD-10-CM | POA: Diagnosis not present

## 2020-04-16 DIAGNOSIS — H353123 Nonexudative age-related macular degeneration, left eye, advanced atrophic without subfoveal involvement: Secondary | ICD-10-CM | POA: Diagnosis not present

## 2020-04-16 DIAGNOSIS — I110 Hypertensive heart disease with heart failure: Secondary | ICD-10-CM | POA: Diagnosis not present

## 2020-04-16 DIAGNOSIS — K3189 Other diseases of stomach and duodenum: Secondary | ICD-10-CM | POA: Diagnosis not present

## 2020-04-16 DIAGNOSIS — Z950 Presence of cardiac pacemaker: Secondary | ICD-10-CM | POA: Diagnosis not present

## 2020-04-16 DIAGNOSIS — I4891 Unspecified atrial fibrillation: Secondary | ICD-10-CM | POA: Diagnosis not present

## 2020-04-16 DIAGNOSIS — K319 Disease of stomach and duodenum, unspecified: Secondary | ICD-10-CM | POA: Diagnosis not present

## 2020-04-16 DIAGNOSIS — D126 Benign neoplasm of colon, unspecified: Secondary | ICD-10-CM | POA: Diagnosis not present

## 2020-04-16 DIAGNOSIS — K5730 Diverticulosis of large intestine without perforation or abscess without bleeding: Secondary | ICD-10-CM | POA: Diagnosis not present

## 2020-04-16 DIAGNOSIS — G4733 Obstructive sleep apnea (adult) (pediatric): Secondary | ICD-10-CM | POA: Diagnosis not present

## 2020-04-16 DIAGNOSIS — D122 Benign neoplasm of ascending colon: Secondary | ICD-10-CM | POA: Diagnosis not present

## 2020-04-16 DIAGNOSIS — Z1211 Encounter for screening for malignant neoplasm of colon: Secondary | ICD-10-CM | POA: Diagnosis not present

## 2020-04-16 DIAGNOSIS — K2970 Gastritis, unspecified, without bleeding: Secondary | ICD-10-CM | POA: Diagnosis not present

## 2020-04-16 DIAGNOSIS — Z87891 Personal history of nicotine dependence: Secondary | ICD-10-CM | POA: Diagnosis not present

## 2020-04-16 DIAGNOSIS — E119 Type 2 diabetes mellitus without complications: Secondary | ICD-10-CM | POA: Diagnosis not present

## 2020-04-16 NOTE — Telephone Encounter (Signed)
Left message for patient to schedule Annual Wellness Visit.  Please schedule with Nurse Health Advisor Martha Stanley, RN at [facility]  °

## 2020-04-20 ENCOUNTER — Telehealth: Payer: Self-pay | Admitting: Family Medicine

## 2020-04-20 DIAGNOSIS — B373 Candidiasis of vulva and vagina: Secondary | ICD-10-CM

## 2020-04-20 DIAGNOSIS — B3731 Acute candidiasis of vulva and vagina: Secondary | ICD-10-CM

## 2020-04-20 NOTE — Telephone Encounter (Signed)
Pt requesting call back from nurse, pt thinks she might have a yeast infection but wants to discuss with nurse. Please advise. CB: [phone]

## 2020-04-21 ENCOUNTER — Other Ambulatory Visit: Payer: Medicare Other

## 2020-04-21 ENCOUNTER — Other Ambulatory Visit: Payer: Self-pay

## 2020-04-21 DIAGNOSIS — R399 Unspecified symptoms and signs involving the genitourinary system: Secondary | ICD-10-CM

## 2020-04-21 DIAGNOSIS — Z13820 Encounter for screening for osteoporosis: Secondary | ICD-10-CM

## 2020-04-21 MED ORDER — FLUCONAZOLE 150 MG PO TABS: 150.0000 mg | ORAL_TABLET | Freq: Once | ORAL | 0 refills | Status: AC

## 2020-04-21 NOTE — Telephone Encounter (Signed)
Rx for diflucan sent to pharm on file

## 2020-04-21 NOTE — Addendum Note (Signed)
Addended by: Lynnea Ferrier on: 04/21/2020 04:01 PM   Modules accepted: Orders

## 2020-04-21 NOTE — Addendum Note (Signed)
Addended by: Lynnea Ferrier on: 04/21/2020 04:46 PM   Modules accepted: Orders

## 2020-04-21 NOTE — Telephone Encounter (Signed)
Dr. Loletha Grayer please advise.  Pt called in stating she believes she has a yeast infection. She said she is experiencing itching and some white discharge. Pt states no back pain or burning. She thinks she needs an antibiotic.  Pt did say she was bringing over a urine sample around 3pm for her urinalysis lab she had ordered 03/25/2020  but couldn't provide urine when she was in office.

## 2020-04-21 NOTE — Telephone Encounter (Signed)
Pt was notified and verbally understood Rx was sent to her pharmacy.  Pt is dropping a urine specimen off around 3 or 4 today for lab.

## 2020-04-22 LAB — URINALYSIS, ROUTINE W REFLEX MICROSCOPIC
Bilirubin Urine: NEGATIVE
Glucose, UA: NEGATIVE
Hgb urine dipstick: NEGATIVE
Hyaline Cast: NONE SEEN /LPF
Nitrite: NEGATIVE
Protein, ur: NEGATIVE
Specific Gravity, Urine: 1.023 (ref 1.001–1.03)
Squamous Epithelial / LPF: >=28 /HPF — AB (ref <=5)
pH: <=5 (ref 5.0–8.0)

## 2020-05-04 DIAGNOSIS — Z1231 Encounter for screening mammogram for malignant neoplasm of breast: Secondary | ICD-10-CM | POA: Diagnosis not present

## 2020-05-04 DIAGNOSIS — Z780 Asymptomatic menopausal state: Secondary | ICD-10-CM | POA: Diagnosis not present

## 2020-05-04 LAB — HM MAMMOGRAPHY

## 2020-05-05 ENCOUNTER — Encounter: Payer: Self-pay | Admitting: Family Medicine

## 2020-05-06 ENCOUNTER — Telehealth: Payer: Self-pay | Admitting: Family Medicine

## 2020-05-06 NOTE — Telephone Encounter (Signed)
Patient would like a call back regarding labs. She states several values are flagged, including but not limited to low GFR.

## 2020-05-06 NOTE — Telephone Encounter (Signed)
Called patient and she is very concerned regarding the GFR being low on last blood work.  I advised her that you were out of the office  and she is okay with waiting till you are back in the office to get an answer. Please review and advise.  Thanks. Dm/cma

## 2020-05-10 DIAGNOSIS — I4891 Unspecified atrial fibrillation: Secondary | ICD-10-CM | POA: Diagnosis not present

## 2020-05-10 DIAGNOSIS — I5042 Chronic combined systolic (congestive) and diastolic (congestive) heart failure: Secondary | ICD-10-CM | POA: Diagnosis not present

## 2020-05-10 DIAGNOSIS — I495 Sick sinus syndrome: Secondary | ICD-10-CM | POA: Diagnosis not present

## 2020-05-10 DIAGNOSIS — I429 Cardiomyopathy, unspecified: Secondary | ICD-10-CM | POA: Diagnosis not present

## 2020-05-10 DIAGNOSIS — I10 Essential (primary) hypertension: Secondary | ICD-10-CM | POA: Diagnosis not present

## 2020-05-11 NOTE — Telephone Encounter (Signed)
Her GFR was low in part due to her BUN being elevated. This could be due to dehydration. Our plan was to recheck labs in 4-86mo and her kidney function would be included in those labs. Pt should work to drink 48-64oz of water per day, limit meds like ibuprofen, aleve.

## 2020-05-11 NOTE — Telephone Encounter (Signed)
Patient notified VIA phone of recommendations and has no further questions. Dm/cma

## 2020-05-13 DIAGNOSIS — M1712 Unilateral primary osteoarthritis, left knee: Secondary | ICD-10-CM | POA: Diagnosis not present

## 2020-05-15 DIAGNOSIS — I509 Heart failure, unspecified: Secondary | ICD-10-CM | POA: Diagnosis not present

## 2020-06-09 ENCOUNTER — Other Ambulatory Visit: Payer: Self-pay | Admitting: Family Medicine

## 2020-06-09 NOTE — Telephone Encounter (Signed)
Please see message and advise.  Thank you. Last fill 09/12/2013 Historical Provider Last OV 04/07/20

## 2020-06-14 DIAGNOSIS — H353212 Exudative age-related macular degeneration, right eye, with inactive choroidal neovascularization: Secondary | ICD-10-CM | POA: Diagnosis not present

## 2020-06-14 DIAGNOSIS — E119 Type 2 diabetes mellitus without complications: Secondary | ICD-10-CM | POA: Diagnosis not present

## 2020-06-14 DIAGNOSIS — H353123 Nonexudative age-related macular degeneration, left eye, advanced atrophic without subfoveal involvement: Secondary | ICD-10-CM | POA: Diagnosis not present

## 2020-06-14 DIAGNOSIS — H5212 Myopia, left eye: Secondary | ICD-10-CM | POA: Diagnosis not present

## 2020-06-14 DIAGNOSIS — Z7984 Long term (current) use of oral hypoglycemic drugs: Secondary | ICD-10-CM | POA: Diagnosis not present

## 2020-06-15 DIAGNOSIS — I509 Heart failure, unspecified: Secondary | ICD-10-CM | POA: Diagnosis not present

## 2020-06-28 ENCOUNTER — Telehealth: Payer: Self-pay | Admitting: Family Medicine

## 2020-06-28 NOTE — Telephone Encounter (Signed)
Spoke to patient and she has an Careers information officer for the Gabapentin at the pharmacy.  Although the Sertraline and pantoprazole were filled by former perovider.   Please review and advise.   Thanks.    Dm/cma

## 2020-06-28 NOTE — Telephone Encounter (Signed)
Pt called and said her pharmacy sent in a refill for gabapentin, sertraline and pantoprazole, she said they told her 2 were denied but I think the pharmacy sent 2 of them to the old provider. Pt would like them to be refilled, please advise

## 2020-06-29 MED ORDER — SERTRALINE HCL 100 MG PO TABS
100.0000 mg | ORAL_TABLET | Freq: Every day | ORAL | 3 refills | Status: DC
Start: 2020-06-29 — End: 2020-08-20

## 2020-06-29 NOTE — Telephone Encounter (Signed)
Pt is completely out of Sertraline and wanted to know if someone can fill it for her since Dr Bryan Lemma is out until next week, please advise

## 2020-06-29 NOTE — Addendum Note (Signed)
Addended by: Dutch Quint B on: 06/29/2020 05:40 PM   Modules accepted: Orders

## 2020-06-30 ENCOUNTER — Other Ambulatory Visit: Payer: Self-pay | Admitting: Family Medicine

## 2020-07-05 NOTE — Telephone Encounter (Signed)
Charlotte please advise, ok to send in Rx? Dr. Loletha Grayer never send in this rx for the pt.    Last CPE--03/10/2020 and last video visit with Dr. Loletha Grayer 04/07/2020.

## 2020-07-12 DIAGNOSIS — I495 Sick sinus syndrome: Secondary | ICD-10-CM | POA: Diagnosis not present

## 2020-07-12 DIAGNOSIS — I429 Cardiomyopathy, unspecified: Secondary | ICD-10-CM | POA: Diagnosis not present

## 2020-07-12 DIAGNOSIS — Z9889 Other specified postprocedural states: Secondary | ICD-10-CM | POA: Diagnosis not present

## 2020-07-12 DIAGNOSIS — I4891 Unspecified atrial fibrillation: Secondary | ICD-10-CM | POA: Diagnosis not present

## 2020-07-12 DIAGNOSIS — I10 Essential (primary) hypertension: Secondary | ICD-10-CM | POA: Diagnosis not present

## 2020-07-15 DIAGNOSIS — I509 Heart failure, unspecified: Secondary | ICD-10-CM | POA: Diagnosis not present

## 2020-08-04 DIAGNOSIS — I495 Sick sinus syndrome: Secondary | ICD-10-CM | POA: Diagnosis not present

## 2020-08-04 DIAGNOSIS — I10 Essential (primary) hypertension: Secondary | ICD-10-CM | POA: Diagnosis not present

## 2020-08-04 DIAGNOSIS — I429 Cardiomyopathy, unspecified: Secondary | ICD-10-CM | POA: Diagnosis not present

## 2020-08-04 DIAGNOSIS — Z9889 Other specified postprocedural states: Secondary | ICD-10-CM | POA: Diagnosis not present

## 2020-08-04 DIAGNOSIS — I4891 Unspecified atrial fibrillation: Secondary | ICD-10-CM | POA: Diagnosis not present

## 2020-08-09 ENCOUNTER — Telehealth: Payer: Self-pay | Admitting: Family Medicine

## 2020-08-09 NOTE — Telephone Encounter (Signed)
Patient is calling to get a refill on her Ropinirole 3mg  (does not take daily, but is completely out). It was prescribed by another doctor that has now retired. She wants to know if Dr. will call this in for her. She is aware that provider will not be in office until Wednesday. Please call her at [phone] if you have any questions.   Pharmacy: [facility]

## 2020-08-11 MED ORDER — ROPINIROLE HCL 3 MG PO TABS
3.0000 mg | ORAL_TABLET | Freq: Every day | ORAL | 1 refills | Status: DC
Start: 2020-08-11 — End: 2020-08-20

## 2020-08-11 NOTE — Telephone Encounter (Signed)
Refill request for: Ropinerole 30 mg  LR 05/11/2008 LOV 04/07/20 FOV  None scheduled.   Please review and advise.  Thanks.    Dm/cma

## 2020-08-11 NOTE — Telephone Encounter (Signed)
Refill sent.

## 2020-08-12 DIAGNOSIS — I495 Sick sinus syndrome: Secondary | ICD-10-CM | POA: Diagnosis not present

## 2020-08-12 DIAGNOSIS — I10 Essential (primary) hypertension: Secondary | ICD-10-CM | POA: Diagnosis not present

## 2020-08-12 DIAGNOSIS — I4891 Unspecified atrial fibrillation: Secondary | ICD-10-CM | POA: Diagnosis not present

## 2020-08-12 DIAGNOSIS — I429 Cardiomyopathy, unspecified: Secondary | ICD-10-CM | POA: Diagnosis not present

## 2020-08-12 DIAGNOSIS — Z9889 Other specified postprocedural states: Secondary | ICD-10-CM | POA: Diagnosis not present

## 2020-08-12 DIAGNOSIS — Z950 Presence of cardiac pacemaker: Secondary | ICD-10-CM | POA: Diagnosis not present

## 2020-08-12 DIAGNOSIS — I5042 Chronic combined systolic (congestive) and diastolic (congestive) heart failure: Secondary | ICD-10-CM | POA: Diagnosis not present

## 2020-08-14 ENCOUNTER — Other Ambulatory Visit: Payer: Self-pay | Admitting: Nurse Practitioner

## 2020-08-15 DIAGNOSIS — I509 Heart failure, unspecified: Secondary | ICD-10-CM | POA: Diagnosis not present

## 2020-08-17 NOTE — Telephone Encounter (Signed)
Pt called because she said the pharmacy told her they havent received anything back on this and the pt is out of medication, please advise

## 2020-08-18 NOTE — Telephone Encounter (Signed)
MEDICATION(S): pantoprazole - pt has been out of medication and called previously   Pharmacy advised her to call MD office as they have not received any RX. (Doc of Day Dr. Loletha Grayer)  Preferred Pharmacy:  [place], [place] - [address] Phone:  [phone]  Fax:  [phone]

## 2020-08-19 ENCOUNTER — Telehealth: Payer: Self-pay | Admitting: Family Medicine

## 2020-08-19 DIAGNOSIS — M1712 Unilateral primary osteoarthritis, left knee: Secondary | ICD-10-CM | POA: Diagnosis not present

## 2020-08-19 NOTE — Telephone Encounter (Signed)
Pt states that she now has [facility]. She said they faxed request for all meds for 3 months supply and refills x 1 year.  Pt worried because she is going out of town Monday and will run out of Eliquis. Advised pt that Dr. Bryan Lemma doesn't appear to fill this medication and she is asking if she could.   Call back [phone]

## 2020-08-19 NOTE — Telephone Encounter (Signed)
Last VV 04/07/20 Last fill 07/05/20  #60/0

## 2020-08-20 MED ORDER — BUPROPION HCL ER (XL) 150 MG PO TB24
150.0000 mg | ORAL_TABLET | Freq: Every day | ORAL | 1 refills | Status: AC
Start: 2020-08-20 — End: ?

## 2020-08-20 MED ORDER — PANTOPRAZOLE SODIUM 40 MG PO TBEC
40.0000 mg | DELAYED_RELEASE_TABLET | Freq: Two times a day (BID) | ORAL | 1 refills | Status: AC
Start: 2020-08-20 — End: ?

## 2020-08-20 MED ORDER — APIXABAN 5 MG PO TABS: 5.0000 mg | ORAL_TABLET | Freq: Two times a day (BID) | ORAL | 0 refills | Status: AC

## 2020-08-20 MED ORDER — SPIRONOLACTONE 25 MG PO TABS: 25.0000 mg | ORAL_TABLET | Freq: Every day | ORAL | 1 refills | Status: AC

## 2020-08-20 MED ORDER — ROPINIROLE HCL 3 MG PO TABS: 3.0000 mg | ORAL_TABLET | Freq: Every day | ORAL | 1 refills | Status: AC

## 2020-08-20 MED ORDER — CARVEDILOL 6.25 MG PO TABS: 6.2500 mg | ORAL_TABLET | Freq: Every day | ORAL | 0 refills | Status: DC

## 2020-08-20 MED ORDER — APIXABAN 5 MG PO TABS
5.0000 mg | ORAL_TABLET | Freq: Two times a day (BID) | ORAL | 1 refills | Status: DC
Start: 2020-08-20 — End: 2020-08-20

## 2020-08-20 MED ORDER — SERTRALINE HCL 100 MG PO TABS: 100.0000 mg | ORAL_TABLET | Freq: Every day | ORAL | 1 refills | Status: DC

## 2020-08-20 NOTE — Telephone Encounter (Signed)
Patient notified VIA phone that RX were sent to the mail order and also sent a 3 week supply to the [facility] as patient requested.  Dm/cma

## 2020-08-24 ENCOUNTER — Telehealth: Payer: Self-pay

## 2020-08-24 MED ORDER — TORSEMIDE 20 MG PO TABS
20.0000 mg | ORAL_TABLET | Freq: Two times a day (BID) | ORAL | 1 refills | Status: AC
Start: 2020-08-24 — End: ?

## 2020-08-24 NOTE — Telephone Encounter (Signed)
LOV 04/26/20     RX- Torsemide filled by historical provider.  Please advise.    Thanks.  Dm/cma

## 2020-08-24 NOTE — Telephone Encounter (Signed)
Pharmacy calling for rx refill for Torsemide 20mg .  Please advise.

## 2020-08-24 NOTE — Telephone Encounter (Signed)
Refilled to [facility]

## 2020-09-15 DIAGNOSIS — I509 Heart failure, unspecified: Secondary | ICD-10-CM | POA: Diagnosis not present

## 2020-09-20 DIAGNOSIS — Z9889 Other specified postprocedural states: Secondary | ICD-10-CM | POA: Diagnosis not present

## 2020-09-20 DIAGNOSIS — I4891 Unspecified atrial fibrillation: Secondary | ICD-10-CM | POA: Diagnosis not present

## 2020-09-20 DIAGNOSIS — I10 Essential (primary) hypertension: Secondary | ICD-10-CM | POA: Diagnosis not present

## 2020-09-20 DIAGNOSIS — I495 Sick sinus syndrome: Secondary | ICD-10-CM | POA: Diagnosis not present

## 2020-09-20 DIAGNOSIS — I429 Cardiomyopathy, unspecified: Secondary | ICD-10-CM | POA: Diagnosis not present

## 2020-09-20 DIAGNOSIS — Z950 Presence of cardiac pacemaker: Secondary | ICD-10-CM | POA: Diagnosis not present

## 2020-09-20 DIAGNOSIS — I5042 Chronic combined systolic (congestive) and diastolic (congestive) heart failure: Secondary | ICD-10-CM | POA: Diagnosis not present

## 2020-10-13 DIAGNOSIS — I509 Heart failure, unspecified: Secondary | ICD-10-CM | POA: Diagnosis not present

## 2020-10-14 ENCOUNTER — Other Ambulatory Visit: Payer: Self-pay | Admitting: Family Medicine

## 2020-10-19 DIAGNOSIS — I10 Essential (primary) hypertension: Secondary | ICD-10-CM | POA: Diagnosis not present

## 2020-10-19 DIAGNOSIS — I4891 Unspecified atrial fibrillation: Secondary | ICD-10-CM | POA: Diagnosis not present

## 2020-11-05 DIAGNOSIS — Z9889 Other specified postprocedural states: Secondary | ICD-10-CM | POA: Diagnosis not present

## 2020-11-05 DIAGNOSIS — I429 Cardiomyopathy, unspecified: Secondary | ICD-10-CM | POA: Diagnosis not present

## 2020-11-05 DIAGNOSIS — I4891 Unspecified atrial fibrillation: Secondary | ICD-10-CM | POA: Diagnosis not present

## 2020-11-05 DIAGNOSIS — Z950 Presence of cardiac pacemaker: Secondary | ICD-10-CM | POA: Diagnosis not present

## 2020-11-05 DIAGNOSIS — I495 Sick sinus syndrome: Secondary | ICD-10-CM | POA: Diagnosis not present

## 2020-11-05 DIAGNOSIS — I5042 Chronic combined systolic (congestive) and diastolic (congestive) heart failure: Secondary | ICD-10-CM | POA: Diagnosis not present

## 2020-11-05 DIAGNOSIS — I10 Essential (primary) hypertension: Secondary | ICD-10-CM | POA: Diagnosis not present

## 2020-11-09 ENCOUNTER — Ambulatory Visit (INDEPENDENT_AMBULATORY_CARE_PROVIDER_SITE_OTHER): Payer: Medicare HMO

## 2020-11-09 VITALS — Ht 63.5 in | Wt 240.0 lb

## 2020-11-09 DIAGNOSIS — Z0000 Encounter for general adult medical examination without abnormal findings: Secondary | ICD-10-CM | POA: Diagnosis not present

## 2020-11-09 NOTE — Patient Instructions (Signed)
Marie Adkins , Thank you for taking time to complete your Medicare Wellness Visit. I appreciate your ongoing commitment to your health goals. Please review the following plan we discussed and let me know if I can assist you in the future.   Screening recommendations/referrals: Colonoscopy: No longer required Mammogram: Completed 05/04/2020-Due 05/04/2021 Bone Density: Completed 05/04/2020-Due 05/04/2022 Recommended yearly ophthalmology/optometry visit for glaucoma screening and checkup Recommended yearly dental visit for hygiene and checkup  Vaccinations: Influenza vaccine: Declined Pneumococcal vaccine: Declined Tdap vaccine: Declined Shingles vaccine: Declined   Covid-19:Declined  Advanced directives: Information mailed today  Conditions/risks identified: See problem list  Next appointment: Follow up in one year for your annual wellness visit 11/15/2021 @ 3:00   Preventive Care 79 Years and Older, Female Preventive care refers to lifestyle choices and visits with your health care provider that can promote health and wellness. What does preventive care include?  A yearly physical exam. This is also called an annual well check.  Dental exams once or twice a year.  Routine eye exams. Ask your health care provider how often you should have your eyes checked.  Personal lifestyle choices, including:  Daily care of your teeth and gums.  Regular physical activity.  Eating a healthy diet.  Avoiding tobacco and drug use.  Limiting alcohol use.  Practicing safe sex.  Taking low-dose aspirin every day.  Taking vitamin and mineral supplements as recommended by your health care provider. What happens during an annual well check? The services and screenings done by your health care provider during your annual well check will depend on your age, overall health, lifestyle risk factors, and family history of disease. Counseling  Your health care provider may ask you questions about  your:  Alcohol use.  Tobacco use.  Drug use.  Emotional well-being.  Home and relationship well-being.  Sexual activity.  Eating habits.  History of falls.  Memory and ability to understand (cognition).  Work and work Statistician.  Reproductive health. Screening  You may have the following tests or measurements:  Height, weight, and BMI.  Blood pressure.  Lipid and cholesterol levels. These may be checked every 5 years, or more frequently if you are over 49 years old.  Skin check.  Lung cancer screening. You may have this screening every year starting at age 79 if you have a 30-pack-year history of smoking and currently smoke or have quit within the past 15 years.  Fecal occult blood test (FOBT) of the stool. You may have this test every year starting at age 30.  Flexible sigmoidoscopy or colonoscopy. You may have a sigmoidoscopy every 5 years or a colonoscopy every 10 years starting at age 79.  Hepatitis C blood test.  Hepatitis B blood test.  Sexually transmitted disease (STD) testing.  Diabetes screening. This is done by checking your blood sugar (glucose) after you have not eaten for a while (fasting). You may have this done every 1-3 years.  Bone density scan. This is done to screen for osteoporosis. You may have this done starting at age 79.  Mammogram. This may be done every 1-2 years. Talk to your health care provider about how often you should have regular mammograms. Talk with your health care provider about your test results, treatment options, and if necessary, the need for more tests. Vaccines  Your health care provider may recommend certain vaccines, such as:  Influenza vaccine. This is recommended every year.  Tetanus, diphtheria, and acellular pertussis (Tdap, Td) vaccine. You may need a  Td booster every 10 years.  Zoster vaccine. You may need this after age 79  Pneumococcal 13-valent conjugate (PCV13) vaccine. One dose is recommended  after age 79  Pneumococcal polysaccharide (PPSV23) vaccine. One dose is recommended after age 79 Talk to your health care provider about which screenings and vaccines you need and how often you need them. This information is not intended to replace advice given to you by your health care provider. Make sure you discuss any questions you have with your health care provider. Document Released: 08/20/2015 Document Revised: 04/12/2016 Document Reviewed: 05/25/2015 Elsevier Interactive Patient Education  2017 [place] Prevention in the Home Falls can cause injuries. They can happen to people of all ages. There are many things you can do to make your home safe and to help prevent falls. What can I do on the outside of my home?  Regularly fix the edges of walkways and driveways and fix any cracks.  Remove anything that might make you trip as you walk through a door, such as a raised step or threshold.  Trim any bushes or trees on the path to your home.  Use bright outdoor lighting.  Clear any walking paths of anything that might make someone trip, such as rocks or tools.  Regularly check to see if handrails are loose or broken. Make sure that both sides of any steps have handrails.  Any raised decks and porches should have guardrails on the edges.  Have any leaves, snow, or ice cleared regularly.  Use sand or salt on walking paths during winter.  Clean up any spills in your garage right away. This includes oil or grease spills. What can I do in the bathroom?  Use night lights.  Install grab bars by the toilet and in the tub and shower. Do not use towel bars as grab bars.  Use non-skid mats or decals in the tub or shower.  If you need to sit down in the shower, use a plastic, non-slip stool.  Keep the floor dry. Clean up any water that spills on the floor as soon as it happens.  Remove soap buildup in the tub or shower regularly.  Attach bath mats securely with  double-sided non-slip rug tape.  Do not have throw rugs and other things on the floor that can make you trip. What can I do in the bedroom?  Use night lights.  Make sure that you have a light by your bed that is easy to reach.  Do not use any sheets or blankets that are too big for your bed. They should not hang down onto the floor.  Have a firm chair that has side arms. You can use this for support while you get dressed.  Do not have throw rugs and other things on the floor that can make you trip. What can I do in the kitchen?  Clean up any spills right away.  Avoid walking on wet floors.  Keep items that you use a lot in easy-to-reach places.  If you need to reach something above you, use a strong step stool that has a grab bar.  Keep electrical cords out of the way.  Do not use floor polish or wax that makes floors slippery. If you must use wax, use non-skid floor wax.  Do not have throw rugs and other things on the floor that can make you trip. What can I do with my stairs?  Do not leave any items on the stairs.  Make sure that there are handrails on both sides of the stairs and use them. Fix handrails that are broken or loose. Make sure that handrails are as long as the stairways.  Check any carpeting to make sure that it is firmly attached to the stairs. Fix any carpet that is loose or worn.  Avoid having throw rugs at the top or bottom of the stairs. If you do have throw rugs, attach them to the floor with carpet tape.  Make sure that you have a light switch at the top of the stairs and the bottom of the stairs. If you do not have them, ask someone to add them for you. What else can I do to help prevent falls?  Wear shoes that:  Do not have high heels.  Have rubber bottoms.  Are comfortable and fit you well.  Are closed at the toe. Do not wear sandals.  If you use a stepladder:  Make sure that it is fully opened. Do not climb a closed stepladder.  Make  sure that both sides of the stepladder are locked into place.  Ask someone to hold it for you, if possible.  Clearly mark and make sure that you can see:  Any grab bars or handrails.  First and last steps.  Where the edge of each step is.  Use tools that help you move around (mobility aids) if they are needed. These include:  Canes.  Walkers.  Scooters.  Crutches.  Turn on the lights when you go into a dark area. Replace any light bulbs as soon as they burn out.  Set up your furniture so you have a clear path. Avoid moving your furniture around.  If any of your floors are uneven, fix them.  If there are any pets around you, be aware of where they are.  Review your medicines with your doctor. Some medicines can make you feel dizzy. This can increase your chance of falling. Ask your doctor what other things that you can do to help prevent falls. This information is not intended to replace advice given to you by your health care provider. Make sure you discuss any questions you have with your health care provider. Document Released: 05/20/2009 Document Revised: 12/30/2015 Document Reviewed: 08/28/2014 Elsevier Interactive Patient Education  2017 [employer].

## 2020-11-09 NOTE — Progress Notes (Signed)
Subjective:   Marie Adkins is a 80 y.o. female who presents for an Initial Medicare Annual Wellness Visit.  I connected with Lyncoln today by telephone and verified that I am speaking with the correct person using two identifiers. Location patient: home Location provider: work Persons participating in the virtual visit: patient, Marine scientist.    I discussed the limitations, risks, security and privacy concerns of performing an evaluation and management service by telephone and the availability of in person appointments. I also discussed with the patient that there may be a patient responsible charge related to this service. The patient expressed understanding and verbally consented to this telephonic visit.    Interactive audio and video telecommunications were attempted between this provider and patient, however failed, due to patient having technical difficulties OR patient did not have access to video capability.  We continued and completed visit with audio only.  Some vital signs may be absent or patient reported.   Time Spent with patient on telephone encounter: 30 minutes    Review of Systems     Cardiac Risk Factors include: advanced age (>1men, >47 women);diabetes mellitus;dyslipidemia;sedentary lifestyle;obesity (BMI >30kg/m2)     Objective:    Today's Vitals   11/09/20 1501  Weight: 240 lb (108.9 kg)  Height: 5' 3.5" (1.613 m)   Body mass index is 41.85 kg/m.  Advanced Directives 11/09/2020 01/08/2018  Does Patient Have a Medical Advance Directive? No No  Would patient like information on creating a medical advance directive? Yes (MAU/Ambulatory/Procedural Areas - Information given) No - Patient declined    Current Medications (verified) Outpatient Encounter Medications as of 11/09/2020  Medication Sig  . acetaminophen (TYLENOL) 500 MG tablet Take by mouth.  Marland Kitchen apixaban (ELIQUIS) 5 MG TABS tablet Take 1 tablet (5 mg total) by mouth 2 (two) times daily.  Marland Kitchen ascorbic Acid  (VITAMIN C) 500 MG CPCR Take by mouth.  Marland Kitchen buPROPion (WELLBUTRIN XL) 150 MG 24 hr tablet Take 1 tablet (150 mg total) by mouth daily.  . carvedilol (COREG) 6.25 MG tablet TAKE 1 TABLET EVERY DAY  . colestipol (COLESTID) 1 g tablet Take by mouth.  . gabapentin (NEURONTIN) 600 MG tablet TAKE 3 TABLETS BY MOUTH EVERY MORNING & TAKE 3 TABLETS EVERY EVENING  . melatonin 5 MG TABS Take 5 mg by mouth.  . MULTIPLE VITAMINS PO Take by mouth.  . pantoprazole (PROTONIX) 40 MG tablet Take 1 tablet (40 mg total) by mouth 2 (two) times daily.  . potassium chloride (KLOR-CON) 10 MEQ tablet Take 10 mEq by mouth daily.  Marland Kitchen QUERCETIN PO Take 500 mg by mouth.  Marland Kitchen rOPINIRole (REQUIP) 3 MG tablet Take 1 tablet (3 mg total) by mouth at bedtime.  . sertraline (ZOLOFT) 100 MG tablet Take 1 tablet (100 mg total) by mouth daily.  . Simethicone 125 MG CAPS Take by mouth.  . spironolactone (ALDACTONE) 25 MG tablet Take 1 tablet (25 mg total) by mouth daily.  Marland Kitchen torsemide (DEMADEX) 20 MG tablet Take 1 tablet (20 mg total) by mouth 2 (two) times daily.  . Vitamin D, Ergocalciferol, (DRISDOL) 1.25 MG (50000 UNIT) CAPS capsule Take 1 capsule (50,000 Units total) by mouth every 7 (seven) days.  Marland Kitchen zinc gluconate 50 MG tablet Take by mouth.   No facility-administered encounter medications on file as of 11/09/2020.    Allergies (verified) Methotrexate derivatives, Codeine, Atorvastatin, Flecainide, and Oxycodone   History: Past Medical History:  Diagnosis Date  . Atrial fibrillation (Bruno)   . Pacemaker  Past Surgical History:  Procedure Laterality Date  . ABDOMINAL HYSTERECTOMY    . APPENDECTOMY    . CHOLECYSTECTOMY    . HERNIA REPAIR    . TOTAL KNEE ARTHROPLASTY     History reviewed. No pertinent family history. Social History   Socioeconomic History  . Marital status: Married    Spouse name: Not on file  . Number of children: Not on file  . Years of education: Not on file  . Highest education level: Not on  file  Occupational History  . Not on file  Tobacco Use  . Smoking status: Never Smoker  . Smokeless tobacco: Never Used  Substance and Sexual Activity  . Alcohol use: Never  . Drug use: Never  . Sexual activity: Not on file  Other Topics Concern  . Not on file  Social History Narrative  . Not on file   Social Determinants of Health   Financial Resource Strain: Low Risk   . Difficulty of Paying Living Expenses: Not hard at all  Food Insecurity: No Food Insecurity  . Worried About Charity fundraiser in the Last Year: Never true  . Ran Out of Food in the Last Year: Never true  Transportation Needs: No Transportation Needs  . Lack of Transportation (Medical): No  . Lack of Transportation (Non-Medical): No  Physical Activity: Inactive  . Days of Exercise per Week: 0 days  . Minutes of Exercise per Session: 0 min  Stress: No Stress Concern Present  . Feeling of Stress : Not at all  Social Connections: Moderately Integrated  . Frequency of Communication with Friends and Family: More than three times a week  . Frequency of Social Gatherings with Friends and Family: More than three times a week  . Attends Religious Services: Never  . Active Member of Clubs or Organizations: Yes  . Attends Archivist Meetings: More than 4 times per year  . Marital Status: Married    Tobacco Counseling Counseling given: Not Answered   Clinical Intake:  Pre-visit preparation completed: Yes  Pain : No/denies pain     Nutritional Status: BMI > 30  Obese Nutritional Risks: None Diabetes: Yes CBG done?: No Did pt. bring in CBG monitor from home?: No (phone visit)  How often do you need to have someone help you when you read instructions, pamphlets, or other written materials from your doctor or pharmacy?: 1 - Never  Diabetes:  Is the patient diabetic?  Yes  If diabetic, was a CBG obtained today?  No  Did the patient bring in their glucometer from home?  No virtual visit How  often do you monitor your CBG's? never.   Financial Strains and Diabetes Management:  Are you having any financial strains with the device, your supplies or your medication? No .  Does the patient want to be seen by Chronic Care Management for management of their diabetes?  No  Would the patient like to be referred to a Nutritionist or for Diabetic Management?  No   Diabetic Exams:  Diabetic Eye Exam: Completed 04/2020 per patient.   Diabetic Foot Exam:. Pt has been advised about the importance in completing this exam. To be completed by PCP     Interpreter Needed?: No  Information entered by :: Caroleen Hamman LPN   Activities of Daily Living In your present state of health, do you have any difficulty performing the following activities: 11/09/2020  Hearing? N  Vision? N  Difficulty concentrating or making decisions? Darreld Mclean  Comment occasionally  Walking or climbing stairs? N  Dressing or bathing? N  Doing errands, shopping? N  Preparing Food and eating ? N  Using the Toilet? N  In the past six months, have you accidently leaked urine? N  Do you have problems with loss of bowel control? N  Managing your Medications? N  Managing your Finances? N  Housekeeping or managing your Housekeeping? N  Some recent data might be hidden    Patient Care Team: Ronnald Nian, DO as PCP - General (Family Medicine)  Indicate any recent Medical Services you may have received from other than [facility] providers in the past year (date may be approximate).     Assessment:   This is a routine wellness examination for Maddyx.  Hearing/Vision screen  Hearing Screening   125Hz  250Hz  500Hz  1000Hz  2000Hz  3000Hz  4000Hz  6000Hz  8000Hz   Right ear:           Left ear:           Comments: No issues  Vision Screening Comments: Wears glasses Last eye exam-04/2020  Dietary issues and exercise activities discussed: Current Exercise Habits: The patient does not participate in regular exercise at present,  Exercise limited by: None identified  Goals    . Patient Stated     Eat healthy, increase activity & drink more water      Depression Screen PHQ 2/9 Scores 11/09/2020 04/07/2020  PHQ - 2 Score 1 0    Fall Risk Fall Risk  11/09/2020 04/07/2020  Falls in the past year? 0 0  Number falls in past yr: 0 0  Injury with Fall? 0 0  Follow up Falls prevention discussed -    FALL RISK PREVENTION PERTAINING TO THE HOME:  Any stairs in or around the home? No  Home free of loose throw rugs in walkways, pet beds, electrical cords, etc? Yes  Adequate lighting in your home to reduce risk of falls? Yes   ASSISTIVE DEVICES UTILIZED TO PREVENT FALLS:  Life alert? No  Use of a cane, walker or w/c? Yes  Grab bars in the bathroom? Yes  Shower chair or bench in shower? No  Elevated toilet seat or a handicapped toilet? No   TIMED UP AND GO:  Was the test performed? No .phone visit    Cognitive Function:Normal cognitive status assessed by  this Nurse Health Advisor. No abnormalities found.       6CIT Screen 11/09/2020  What Year? 0 points  What month? 0 points  What time? 0 points  Count back from 20 0 points  Months in reverse 0 points  Repeat phrase 0 points  Total Score 0    Immunizations  There is no immunization history on file for this patient.  TDAP status: Due, Education has been provided regarding the importance of this vaccine. Advised may receive this vaccine at local pharmacy or Health Dept. Aware to provide a copy of the vaccination record if obtained from local pharmacy or Health Dept. Verbalized acceptance and understanding.  Flu Vaccine status: Declined, Education has been provided regarding the importance of this vaccine but patient still declined. Advised may receive this vaccine at local pharmacy or Health Dept. Aware to provide a copy of the vaccination record if obtained from local pharmacy or Health Dept. Verbalized acceptance and understanding.  Pneumococcal vaccine  status: Declined,  Education has been provided regarding the importance of this vaccine but patient still declined. Advised may receive this vaccine at local pharmacy or Health  Dept. Aware to provide a copy of the vaccination record if obtained from local pharmacy or Health Dept. Verbalized acceptance and understanding.   Covid-19 vaccine status: Declined, Education has been provided regarding the importance of this vaccine but patient still declined. Advised may receive this vaccine at local pharmacy or Health Dept.or vaccine clinic. Aware to provide a copy of the vaccination record if obtained from local pharmacy or Health Dept. Verbalized acceptance and understanding.  Qualifies for Shingles Vaccine? Yes   Zostavax completed No   Shingrix Completed?: No.    Education has been provided regarding the importance of this vaccine. Patient has been advised to call insurance company to determine out of pocket expense if they have not yet received this vaccine. Advised may also receive vaccine at local pharmacy or Health Dept. Verbalized acceptance and understanding.  Screening Tests Health Maintenance  Topic Date Due  . Hepatitis C Screening  Never done  . FOOT EXAM  Never done  . OPHTHALMOLOGY EXAM  Never done  . URINE MICROALBUMIN  Never done  . HEMOGLOBIN A1C  09/25/2020  . COVID-19 Vaccine (1) 11/25/2020 (Originally 08/26/1945)  . TETANUS/TDAP  11/09/2021 (Originally 12/31/2012)  . PNA vac Low Risk Adult (1 of 2 - PCV13) 11/09/2021 (Originally 08/26/2006)  . INFLUENZA VACCINE  03/07/2021  . DEXA SCAN  Completed  . HPV VACCINES  Aged Out    Health Maintenance  Health Maintenance Due  Topic Date Due  . Hepatitis C Screening  Never done  . FOOT EXAM  Never done  . OPHTHALMOLOGY EXAM  Never done  . URINE MICROALBUMIN  Never done  . HEMOGLOBIN A1C  09/25/2020    Colorectal cancer screening: No longer required.   Mammogram status: Completed Bilateral 05/04/2020. Repeat every year  Bone  Density status: Completed 05/04/2020. Results reflect: Bone density results: OSTEOPENIA. Repeat every 2 years.  Lung Cancer Screening: (Low Dose CT Chest recommended if Age 70-80 years, 30 pack-year currently smoking OR have quit w/in 15years.) does not qualify.    Additional Screening:  Hepatitis C Screening: does not qualify  Vision Screening: Recommended annual ophthalmology exams for early detection of glaucoma and other disorders of the eye. Is the patient up to date with their annual eye exam?  Yes  Who is the provider or what is the name of the office in which the patient attends annual eye exams? Unsure of name   Dental Screening: Recommended annual dental exams for proper oral hygiene  Community Resource Referral / Chronic Care Management: CRR required this visit?  No   CCM required this visit?  No      Plan:     I have personally reviewed and noted the following in the patient's chart:   . Medical and social history . Use of alcohol, tobacco or illicit drugs  . Current medications and supplements . Functional ability and status . Nutritional status . Physical activity . Advanced directives . List of other physicians . Hospitalizations, surgeries, and ER visits in previous 12 months . Vitals . Screenings to include cognitive, depression, and falls . Referrals and appointments  In addition, I have reviewed and discussed with patient certain preventive protocols, quality metrics, and best practice recommendations. A written personalized care plan for preventive services as well as general preventive health recommendations were provided to patient.   Due to this being a telephonic visit, the after visit summary with patients personalized plan was offered to patient via mail or my-chart.  Patient would like to access  on my-chart.   Marta Antu, LPN   10/09/8249  Nurse Health Advisor  Nurse Notes: None

## 2020-11-11 DIAGNOSIS — I110 Hypertensive heart disease with heart failure: Secondary | ICD-10-CM | POA: Diagnosis not present

## 2020-11-11 DIAGNOSIS — R0602 Shortness of breath: Secondary | ICD-10-CM | POA: Diagnosis not present

## 2020-11-11 DIAGNOSIS — I4891 Unspecified atrial fibrillation: Secondary | ICD-10-CM | POA: Diagnosis not present

## 2020-11-11 DIAGNOSIS — G2581 Restless legs syndrome: Secondary | ICD-10-CM | POA: Diagnosis not present

## 2020-11-11 DIAGNOSIS — R059 Cough, unspecified: Secondary | ICD-10-CM | POA: Diagnosis not present

## 2020-11-11 DIAGNOSIS — E119 Type 2 diabetes mellitus without complications: Secondary | ICD-10-CM | POA: Diagnosis not present

## 2020-11-11 DIAGNOSIS — I509 Heart failure, unspecified: Secondary | ICD-10-CM | POA: Diagnosis not present

## 2020-11-11 DIAGNOSIS — R531 Weakness: Secondary | ICD-10-CM | POA: Diagnosis not present

## 2020-11-11 DIAGNOSIS — Z8616 Personal history of COVID-19: Secondary | ICD-10-CM | POA: Diagnosis not present

## 2020-11-13 DIAGNOSIS — I509 Heart failure, unspecified: Secondary | ICD-10-CM | POA: Diagnosis not present

## 2021-05-06 ENCOUNTER — Other Ambulatory Visit: Payer: Self-pay | Admitting: Family

## 2021-05-23 LAB — HM MAMMOGRAPHY

## 2021-05-27 ENCOUNTER — Encounter: Payer: Self-pay | Admitting: Family Medicine

## 2021-09-12 ENCOUNTER — Other Ambulatory Visit: Payer: Self-pay | Admitting: Family

## 2021-10-14 ENCOUNTER — Other Ambulatory Visit: Payer: Self-pay | Admitting: Family

## 2021-10-17 ENCOUNTER — Other Ambulatory Visit: Payer: Self-pay | Admitting: Family

## 2021-11-15 ENCOUNTER — Ambulatory Visit: Payer: Medicare HMO
